# Patient Record
Sex: Female | Born: 1965 | Race: White | Smoking: Never smoker
Health system: Southern US, Community
[De-identification: ages and names within clinical notes are randomized; demographics above are authoritative.]

## PROBLEM LIST (undated history)

## (undated) DIAGNOSIS — K760 Fatty (change of) liver, not elsewhere classified: Secondary | ICD-10-CM

## (undated) DIAGNOSIS — G4730 Sleep apnea, unspecified: Secondary | ICD-10-CM

## (undated) DIAGNOSIS — Z87442 Personal history of urinary calculi: Secondary | ICD-10-CM

## (undated) DIAGNOSIS — Z9889 Other specified postprocedural states: Secondary | ICD-10-CM

## (undated) DIAGNOSIS — R51 Headache: Secondary | ICD-10-CM

## (undated) DIAGNOSIS — R112 Nausea with vomiting, unspecified: Secondary | ICD-10-CM

## (undated) DIAGNOSIS — F329 Major depressive disorder, single episode, unspecified: Secondary | ICD-10-CM

## (undated) DIAGNOSIS — M797 Fibromyalgia: Secondary | ICD-10-CM

## (undated) DIAGNOSIS — F32A Depression, unspecified: Secondary | ICD-10-CM

## (undated) DIAGNOSIS — N3010 Interstitial cystitis (chronic) without hematuria: Secondary | ICD-10-CM

## (undated) DIAGNOSIS — F419 Anxiety disorder, unspecified: Secondary | ICD-10-CM

## (undated) DIAGNOSIS — R519 Headache, unspecified: Secondary | ICD-10-CM

## (undated) HISTORY — PX: OTHER SURGICAL HISTORY: SHX169

## (undated) HISTORY — PX: FOOT SURGERY: SHX648

## (undated) HISTORY — PX: CYSTOSCOPY WITH HYDRODISTENSION AND BIOPSY: SHX5127

## (undated) HISTORY — PX: ABDOMINAL HYSTERECTOMY: SHX81

---

## 2014-03-11 ENCOUNTER — Other Ambulatory Visit: Payer: Self-pay | Admitting: Specialist

## 2014-03-11 DIAGNOSIS — M1610 Unilateral primary osteoarthritis, unspecified hip: Secondary | ICD-10-CM

## 2014-03-15 ENCOUNTER — Ambulatory Visit
Admission: RE | Admit: 2014-03-15 | Discharge: 2014-03-15 | Disposition: A | Discharge status: Home / Self Care | Payer: Medicaid Other | Source: Ambulatory Visit | Attending: Specialist | Admitting: Specialist

## 2014-03-15 DIAGNOSIS — M1610 Unilateral primary osteoarthritis, unspecified hip: Secondary | ICD-10-CM

## 2014-03-15 MED ORDER — IOHEXOL 180 MG/ML  SOLN
1.0000 mL | Freq: Once | INTRAMUSCULAR | Status: AC | PRN
  Administered 2014-03-15: 1 mL via INTRA_ARTICULAR

## 2014-03-15 MED ORDER — METHYLPREDNISOLONE ACETATE 40 MG/ML INJ SUSP (RADIOLOG
120.0000 mg | Freq: Once | INTRAMUSCULAR | Status: AC
  Administered 2014-03-15: 120 mg via INTRA_ARTICULAR

## 2014-03-15 NOTE — Discharge Instructions (Signed)

## 2014-03-15 NOTE — Progress Notes (Signed)
Cold pack to injection site.

## 2014-09-05 ENCOUNTER — Other Ambulatory Visit: Payer: Self-pay | Admitting: Specialist

## 2014-09-05 DIAGNOSIS — M545 Low back pain: Secondary | ICD-10-CM

## 2014-09-11 ENCOUNTER — Other Ambulatory Visit: Payer: Medicaid Other

## 2014-09-30 ENCOUNTER — Ambulatory Visit
Admission: RE | Admit: 2014-09-30 | Discharge: 2014-09-30 | Disposition: A | Discharge status: Home / Self Care | Payer: Medicaid Other | Source: Ambulatory Visit | Attending: Specialist | Admitting: Specialist

## 2014-09-30 DIAGNOSIS — M545 Low back pain: Secondary | ICD-10-CM

## 2016-03-26 ENCOUNTER — Telehealth (INDEPENDENT_AMBULATORY_CARE_PROVIDER_SITE_OTHER): Payer: Self-pay | Admitting: Specialist

## 2016-03-26 NOTE — Telephone Encounter (Signed)
Patient scheduled 05/06/16 at 3:00pm [phone]

## 2016-05-06 ENCOUNTER — Ambulatory Visit (INDEPENDENT_AMBULATORY_CARE_PROVIDER_SITE_OTHER): Payer: Self-pay | Admitting: Specialist

## 2016-05-27 ENCOUNTER — Ambulatory Visit (INDEPENDENT_AMBULATORY_CARE_PROVIDER_SITE_OTHER): Payer: Medicaid Other

## 2016-05-27 ENCOUNTER — Ambulatory Visit (INDEPENDENT_AMBULATORY_CARE_PROVIDER_SITE_OTHER): Payer: Medicaid Other | Admitting: Specialist

## 2016-05-27 ENCOUNTER — Encounter (INDEPENDENT_AMBULATORY_CARE_PROVIDER_SITE_OTHER): Payer: Self-pay | Admitting: Specialist

## 2016-05-27 VITALS — BP 121/85 | HR 78 | Ht 65.0 in | Wt 220.0 lb

## 2016-05-27 DIAGNOSIS — G8929 Other chronic pain: Secondary | ICD-10-CM | POA: Diagnosis not present

## 2016-05-27 DIAGNOSIS — M25512 Pain in left shoulder: Secondary | ICD-10-CM | POA: Diagnosis not present

## 2016-05-27 DIAGNOSIS — M533 Sacrococcygeal disorders, not elsewhere classified: Secondary | ICD-10-CM

## 2016-05-27 DIAGNOSIS — M545 Low back pain: Secondary | ICD-10-CM | POA: Diagnosis not present

## 2016-05-27 MED ORDER — METHYLPREDNISOLONE ACETATE 40 MG/ML IJ SUSP
40.0000 mg | INTRAMUSCULAR | Status: AC | PRN
  Administered 2016-05-27: 40 mg via INTRA_ARTICULAR

## 2016-05-27 MED ORDER — BUPIVACAINE HCL 0.25 % IJ SOLN
4.0000 mL | INTRAMUSCULAR | Status: AC | PRN
  Administered 2016-05-27: 4 mL via INTRA_ARTICULAR

## 2016-05-27 MED ORDER — LIDOCAINE HCL 1 % IJ SOLN
3.0000 mL | INTRAMUSCULAR | Status: AC | PRN
  Administered 2016-05-27: 3 mL

## 2016-05-27 NOTE — Progress Notes (Signed)
Office Visit Note   Patient: Katrina Clark           Date of Birth: Apr 21, 1965           MRN: [id number] Visit Date: 05/27/2016              Requested by: No referring provider defined for this encounter. PCP: Jerry Caras   Assessment & Plan: Visit Diagnoses:  1. Chronic left shoulder pain   2. Low back pain, unspecified back pain laterality, unspecified chronicity, with sciatica presence unspecified   3. Sacrococcygeal pain   4. Chronic SI joint pain     Plan: Degenerative patient some relief of her shoulder pain I offered injection. Patient sent left shoulder subacromial Marcaine/Depo-Medrol injection was done from a posterolateral approach. We will schedule patient for bilateral diagnostic/therapeutic SI joint injections with Dr. Alvester Morin. She's had this done back in February 2016 and thinks that she did pretty well with that. Last office visit with Dr. Otelia Sergeant a couple years ago he ordered a three-phase bone scan for sacrococcygeal pain but it was not done. reordered the study today. We'll see patient back in 3 weeks for recheck. If her shoulder is not better I  will also plan to schedule MRI to rule out rotator cuff tear. All questions answered.   Follow-Up Instructions: Return in about 3 weeks (around 06/17/2016).   Orders:  Orders Placed This Encounter  Procedures  . Large Joint Injection/Arthrocentesis  . XR Shoulder Left  . XR Lumbar Spine 2-3 Views  . NM Bone Scan 3 Phase  . Ambulatory referral to Physical Medicine Rehab   No orders of the defined types were placed in this encounter.     Procedures: Large Joint Inj Date/Time: 05/27/2016 3:23 PM Performed by: Naida Sleight Authorized by: Naida Sleight   Consent Given by:  Patient Indications:  Pain Location:  Shoulder Site:  L subacromial bursa Needle Size:  25 G Needle Length:  1.5 inches Approach:  Posterior Ultrasound Guidance: No   Fluoroscopic Guidance: No   Arthrogram: No   Medications:  3 mL  lidocaine 1 %; 4 mL bupivacaine 0.25 %; 40 mg methylPREDNISolone acetate 40 MG/ML Aspiration Attempted: No   Patient tolerance:  Patient tolerated the procedure well with no immediate complications     Clinical Data: No additional findings.   Subjective: Chief Complaint  Patient presents with  . Left Shoulder - Pain  . Lower Back - Pain    [name] is here for her left shoulder pain and low back pain.  She states that her low back is popping when she is bending over and hurts when she stands back up.  Also a couple of years ago she did something to her shoulder and now she has flared it back up again.    Patient has known history of bilateral SI joint pain and coccydynia. She's had injections in both areas in the past. No improvement with the coccyx injection.  Couple years ago Dr. Otelia Sergeant had ordered a bone scan but this was not done.  Left shoulder pain with overactivity and reach mid her back. Does bother her at night when she lies on her left side. No recent injury. She had left shoulder subacromial Marcaine/Depo-Medrol injection performed October 2016 with good relief. Recent left shoulder pain ongoing for a few weeks. No cervical spine or radicular component.  Review of Systems  Constitutional: Negative.   HENT: Negative.   Respiratory: Negative.   Cardiovascular: Negative.  Genitourinary: Negative.   Musculoskeletal: Positive for back pain.  Neurological: Negative.   Psychiatric/Behavioral: Negative.      Objective: Vital Signs: BP 121/85 (BP Location: Left Arm, Patient Position: Sitting)   Pulse 78   Ht 5\' 5"  (1.651 m)   Wt 220 lb (99.8 kg)   BMI 36.61 kg/m   Physical Exam  Constitutional: She is oriented to person, place, and time. No distress.  HENT:  Head: Normocephalic and atraumatic.  Eyes: EOM are normal. Pupils are equal, round, and reactive to light.  Neck: Normal range of motion.  Pulmonary/Chest: No respiratory distress.  Musculoskeletal:  Gait is  normal. Cervical spine good range of motion. Left shoulder she has good range of motion but with some discomfort. Negative drop arm test. Positive impingement test. Pain with supraspinatus resistance. Right shoulder unremarkable. She is moderately tender over the bilateral SI joints. Positive FABER test. Negative straight leg raise. Neurovascularly intact. No focal motor deficits.  Neurological: She is alert and oriented to person, place, and time.  Skin: Skin is warm and dry.  Psychiatric: She has a normal mood and affect.    Ortho Exam  Specialty Comments:  No specialty comments available.  Imaging: Xr Lumbar Spine 2-3 Views  Result Date: 05/27/2016 X-rays lumbar spine shows disc spaces to be well-maintained. No listhesis. No acute findings.  Xr Shoulder Left  Result Date: 05/27/2016 X-ray left shoulder shows good bony anatomy. No acute findings. Type I acromion.    PMFS History: There are no active problems to display for this patient.  No past medical history on file.  No family history on file.  No past surgical history on file. Social History   Occupational History  . Not on file.   Social History Main Topics  . Smoking status: Never Smoker  . Smokeless tobacco: Never Used  . Alcohol use Not on file  . Drug use: Unknown  . Sexual activity: Not on file

## 2016-06-10 ENCOUNTER — Encounter (INDEPENDENT_AMBULATORY_CARE_PROVIDER_SITE_OTHER): Payer: Self-pay | Admitting: Physical Medicine and Rehabilitation

## 2016-06-10 ENCOUNTER — Ambulatory Visit (INDEPENDENT_AMBULATORY_CARE_PROVIDER_SITE_OTHER): Payer: Medicaid Other | Admitting: Physical Medicine and Rehabilitation

## 2016-06-10 ENCOUNTER — Ambulatory Visit (INDEPENDENT_AMBULATORY_CARE_PROVIDER_SITE_OTHER): Payer: Medicaid Other

## 2016-06-10 VITALS — BP 134/84 | HR 86

## 2016-06-10 DIAGNOSIS — M461 Sacroiliitis, not elsewhere classified: Secondary | ICD-10-CM

## 2016-06-10 NOTE — Progress Notes (Signed)
Katrina Clark - 51 y.o. female MRN [id number]  Date of birth: [birth year]  Office Visit Note: Visit Date: 06/10/2016 PCP: Jerry Caras, MD Referred by: Jerry Caras, MD  Subjective: Chief Complaint  Patient presents with  . Lower Back - Pain   HPI: Katrina Clark is a 51 year old female with chronic worsening midline sacral and pelvic pain. Pain in the center of low back and then radiates down tailbone. Constant pain. Worse with standing. Denies pain down leg. Dr. Otelia Sergeant requests bilateral sacroiliac joint injection.    ROS Otherwise per HPI.  Assessment & Plan: Visit Diagnoses:  1. Sacroiliitis (HCC)     Plan: Findings:  Bilateral sacroiliac joint injections with fluoroscopic guidance. The arthrogram showed partial flow into the lower third of both joints. The feel of the needle in both joints was intra-articular.    Meds & Orders: No orders of the defined types were placed in this encounter.   Orders Placed This Encounter  Procedures  . Large Joint Injection/Arthrocentesis  . Large Joint Injection/Arthrocentesis  . XR C-ARM NO REPORT    Follow-up: No Follow-up on file.   Procedures: Large Joint Inj Date/Time: 06/10/2016 3:37 PM Performed by: Tyrell Antonio Authorized by: Tyrell Antonio   Consent Given by:  Patient Site marked: the procedure site was marked   Timeout: prior to procedure the correct patient, procedure, and site was verified   Indications:  Pain and diagnostic evaluation Location:  Sacroiliac Site:  L sacroiliac joint Prep: patient was prepped and draped in usual sterile fashion   Needle Size:  22 G Needle Length:  3.5 inches Approach:  Posterior Ultrasound Guidance: No   Fluoroscopic Guidance: Yes   Arthrogram: No   Medications:  2 mL bupivacaine 0.5 %; 40 mg triamcinolone acetonide 40 MG/ML Aspiration Attempted: No   Patient tolerance:  Patient tolerated the procedure well with no immediate complications  There was excellent  flow of contrast producing a partial arthrogram of the sacroiliac joint.  Large Joint Inj Date/Time: 06/10/2016 3:38 PM Performed by: Tyrell Antonio Authorized by: Tyrell Antonio   Consent Given by:  Patient Site marked: the procedure site was marked   Timeout: prior to procedure the correct patient, procedure, and site was verified   Indications:  Pain and diagnostic evaluation Location:  Sacroiliac Site:  R sacroiliac joint Prep: patient was prepped and draped in usual sterile fashion   Needle Size:  22 G Needle Length:  3.5 inches Approach:  Posterior Ultrasound Guidance: No   Fluoroscopic Guidance: Yes   Arthrogram: No   Medications:  2 mL bupivacaine 0.5 %; 40 mg triamcinolone acetonide 40 MG/ML Aspiration Attempted: No   Patient tolerance:  Patient tolerated the procedure well with no immediate complications  There was excellent flow of contrast producing a partial arthrogram of the sacroiliac joint.     No notes on file   Clinical History: No specialty comments available.  She reports that she has never smoked. She has never used smokeless tobacco. No results for input(s): HGBA1C, LABURIC in the last 8760 hours.  Objective:  VS:  HT:    WT:   BMI:     BP:134/84  HR:86bpm  TEMP: ( )  RESP:  Physical Exam  Musculoskeletal:  Pain over the bilateral PSIS with pain to palpation in the midline sacral area. Good strength    Ortho Exam Imaging: No results found.  Past Medical/Family/Surgical/Social History: Medications & Allergies reviewed per EMR There are no active problems to display for this patient.  No past medical history on file. No family history on file. No past surgical history on file. Social History   Occupational History  . Not on file.   Social History Main Topics  . Smoking status: Never Smoker  . Smokeless tobacco: Never Used  . Alcohol use Not on file  . Drug use: Unknown  . Sexual activity: Not on file

## 2016-06-10 NOTE — Patient Instructions (Signed)

## 2016-06-14 ENCOUNTER — Encounter (HOSPITAL_COMMUNITY)
Admission: RE | Admit: 2016-06-14 | Discharge: 2016-06-14 | Disposition: A | Discharge status: Home / Self Care | Payer: Medicaid Other | Source: Ambulatory Visit | Attending: Surgery | Admitting: Surgery

## 2016-06-14 DIAGNOSIS — M533 Sacrococcygeal disorders, not elsewhere classified: Secondary | ICD-10-CM | POA: Insufficient documentation

## 2016-06-14 MED ORDER — BUPIVACAINE HCL 0.5 % IJ SOLN
2.0000 mL | INTRAMUSCULAR | Status: AC | PRN
  Administered 2016-06-10: 2 mL via INTRA_ARTICULAR

## 2016-06-14 MED ORDER — TECHNETIUM TC 99M MEDRONATE IV KIT
25.0000 | PACK | Freq: Once | INTRAVENOUS | Status: AC | PRN
  Administered 2016-06-14: 19.2 via INTRAVENOUS

## 2016-06-14 MED ORDER — TRIAMCINOLONE ACETONIDE 40 MG/ML IJ SUSP
40.0000 mg | INTRAMUSCULAR | Status: AC | PRN
  Administered 2016-06-10: 40 mg via INTRA_ARTICULAR

## 2016-07-08 ENCOUNTER — Encounter (INDEPENDENT_AMBULATORY_CARE_PROVIDER_SITE_OTHER): Payer: Self-pay | Admitting: Specialist

## 2016-07-08 ENCOUNTER — Ambulatory Visit (INDEPENDENT_AMBULATORY_CARE_PROVIDER_SITE_OTHER): Payer: Medicaid Other | Admitting: Specialist

## 2016-07-08 VITALS — BP 121/81 | HR 70 | Ht 65.0 in | Wt 220.0 lb

## 2016-07-08 DIAGNOSIS — M5136 Other intervertebral disc degeneration, lumbar region: Secondary | ICD-10-CM | POA: Diagnosis not present

## 2016-07-08 DIAGNOSIS — M533 Sacrococcygeal disorders, not elsewhere classified: Secondary | ICD-10-CM | POA: Diagnosis not present

## 2016-07-08 MED ORDER — TETRACYCLINE HCL 500 MG PO CAPS: 500.0000 mg | ORAL_CAPSULE | Freq: Four times a day (QID) | ORAL | 0 refills | Status: DC

## 2016-07-08 NOTE — Progress Notes (Signed)
   Office Visit Note   Patient: Katrina MurrainSandra Clark           Date of Birth: [birth year]           MRN: [id number] Visit Date: 07/08/2016              Requested by: Jerry CarasPatterson, Jonathan, MD [address] [place], [place] [id number] PCP: Jerry CarasPatterson, Jonathan, MD   Assessment & Plan: Visit Diagnoses:  1. Sacroiliac joint dysfunction of left side   2. Degenerative disc disease, lumbar     Plan:Avoid frequent bending and stooping  No lifting greater than 10 lbs. May use ice or moist heat for pain. Weight loss is of benefit. 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     Follow-Up Instructions: No Follow-up on file.   Orders:  No orders of the defined types were placed in this encounter.  Meds ordered this encounter  Medications  . tetracycline (ACHROMYCIN,SUMYCIN) 500 MG capsule    Sig: Take 1 capsule (500 mg total) by mouth 4 (four) times daily.    Dispense:  40 capsule    Refill:  0      Procedures: No procedures performed   Clinical Data: No additional findings.   Subjective: Chief Complaint  Patient presents with  . Lower Back - Follow-up    HPI  Review of Systems   Objective: Vital Signs: BP 121/81 (BP Location: Right Arm, Patient Position: Sitting)   Pulse 70   Ht 5\' 5"  (1.651 m)   Wt 220 lb (99.8 kg)   BMI 36.61 kg/m   Physical Exam  Back Exam   Tenderness  The patient is experiencing tenderness in the sacroiliac and lumbar.  Range of Motion  Extension: normal  Flexion: normal  Lateral Bend Right: normal  Lateral Bend Left: normal  Rotation Right: normal  Rotation Left: normal   Muscle Strength  Right Quadriceps:  5/5  Left Quadriceps:  5/5  Right Hamstrings:  5/5  Left Hamstrings:  5/5   Tests  Straight leg raise right: negative Straight leg raise left: negative  Reflexes  Babinski's sign: normal   Other  Toe Walk: normal Heel Walk: normal Sensation: normal Gait: normal  Erythema: no back  redness Scars: absent  Comments:  Figure of four is painful left greater than right.      Specialty Comments:  No specialty comments available.  Imaging: No results found.   PMFS History: There are no active problems to display for this patient.  No past medical history on file.  No family history on file.  No past surgical history on file. Social History   Occupational History  . Not on file.   Social History Main Topics  . Smoking status: Never Smoker  . Smokeless tobacco: Never Used  . Alcohol use Not on file  . Drug use: Unknown  . Sexual activity: Not on file

## 2016-07-08 NOTE — Patient Instructions (Addendum)
Avoid frequent bending and stooping  No lifting greater than 10 lbs. May use ice or moist heat for pain. Weight loss is of benefit. 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

## 2016-07-19 ENCOUNTER — Other Ambulatory Visit (INDEPENDENT_AMBULATORY_CARE_PROVIDER_SITE_OTHER): Payer: Self-pay | Admitting: Specialist

## 2016-07-19 ENCOUNTER — Telehealth (INDEPENDENT_AMBULATORY_CARE_PROVIDER_SITE_OTHER): Payer: Self-pay | Admitting: Radiology

## 2016-07-19 MED ORDER — AZITHROMYCIN 500 MG PO TABS: 1000.0000 mg | ORAL_TABLET | Freq: Every day | ORAL | 0 refills | Status: DC

## 2016-07-19 NOTE — Telephone Encounter (Signed)
Rx for 2 500 mg tablets of Azithromax sent to her pharmacy, she should take both once. jen

## 2016-07-19 NOTE — Telephone Encounter (Signed)
I called lmom for patient to advised her the medication was denied and that we had sent in something else and that she should take both tablets at one time. If she has questions she can call us back.

## 2016-07-19 NOTE — Telephone Encounter (Signed)
Insurance has denied Tetracycline due to not trying and failing 2 preferred drugs first.  I am not sure if there is something else you want to try for her?

## 2016-08-27 ENCOUNTER — Ambulatory Visit (INDEPENDENT_AMBULATORY_CARE_PROVIDER_SITE_OTHER): Payer: Medicaid Other | Admitting: Specialist

## 2016-12-02 ENCOUNTER — Ambulatory Visit (INDEPENDENT_AMBULATORY_CARE_PROVIDER_SITE_OTHER): Payer: Medicaid Other

## 2016-12-02 ENCOUNTER — Ambulatory Visit (INDEPENDENT_AMBULATORY_CARE_PROVIDER_SITE_OTHER): Payer: Medicaid Other | Admitting: Surgery

## 2016-12-02 ENCOUNTER — Encounter (INDEPENDENT_AMBULATORY_CARE_PROVIDER_SITE_OTHER): Payer: Self-pay | Admitting: Surgery

## 2016-12-02 VITALS — BP 113/79 | HR 71 | Ht 65.0 in | Wt 220.0 lb

## 2016-12-02 DIAGNOSIS — M542 Cervicalgia: Secondary | ICD-10-CM | POA: Diagnosis not present

## 2016-12-02 DIAGNOSIS — L03011 Cellulitis of right finger: Secondary | ICD-10-CM | POA: Diagnosis not present

## 2016-12-02 DIAGNOSIS — G8929 Other chronic pain: Secondary | ICD-10-CM | POA: Diagnosis not present

## 2016-12-02 DIAGNOSIS — W540XXA Bitten by dog, initial encounter: Secondary | ICD-10-CM | POA: Diagnosis not present

## 2016-12-02 DIAGNOSIS — M79644 Pain in right finger(s): Secondary | ICD-10-CM

## 2016-12-02 DIAGNOSIS — M25512 Pain in left shoulder: Secondary | ICD-10-CM | POA: Diagnosis not present

## 2016-12-02 MED ORDER — KETOROLAC TROMETHAMINE 30 MG/ML IJ SOLN: 30.0000 mg | Freq: Once | INTRAMUSCULAR | Status: AC

## 2016-12-02 MED ORDER — DOXYCYCLINE HYCLATE 100 MG PO CAPS: 100.0000 mg | ORAL_CAPSULE | Freq: Two times a day (BID) | ORAL | 0 refills | Status: DC

## 2016-12-02 MED ORDER — METRONIDAZOLE 500 MG PO TABS: 500.0000 mg | ORAL_TABLET | Freq: Three times a day (TID) | ORAL | 0 refills | Status: DC

## 2016-12-02 MED ORDER — METHOCARBAMOL 500 MG PO TABS: 500.0000 mg | ORAL_TABLET | Freq: Three times a day (TID) | ORAL | 0 refills | Status: DC | PRN

## 2016-12-02 NOTE — Progress Notes (Signed)
Office Visit Note   Patient: Katrina Clark           Date of Birth: Oct 30, 1965           MRN: [id number] Visit Date: 12/02/2016              Requested by: Jerry Caras, MD [address] [place], [place] [address] PCP: Jerry Caras, MD   Assessment & Plan: Visit Diagnoses:  1. Neck pain   2. Chronic left shoulder pain   3. Pain of right thumb   4. Cellulitis of right thumb   5. Dog bite, initial encounter     Plan: Regards to patient's shoulder pain offered conservative treatment with portal 30 mg IM injection. Patient also given prescription for Robaxin 500 mg 1 tab by mouth every 8 hours when necessary spasms. She can continue over-the-counter ibuprofen when necessary with food. Advised patient that I am somewhat concerned regarding the dog bite to her right thumb. I did ask Dr. August Saucer to see patient with me as well. He recommended drilling 3 small holes into the nailbed to help decompress this area some. After patient consent distal thumb was prepped with Betadine and I used 3 separate 18-gauge needles to drill a small hole into the nailbed. I was able to get out a small amount of bloody serous colored fluid. Fluid was sent for culture. The plan was to put patient on Augmentin but she does have a true penicillin allergy. Spoke with the [facility] inpatient pharmacist and he recommended doxycycline 100 mg by mouth twice a day and Flagyl 500 mg by mouth 3 times a day. Patient will follow up in the office Monday morning with Dr. August Saucer for recheck. Advised patient that she can change her dressing daily and if she has any increased pain, redness, swelling go immediately to the ER. She'll also watch out for pain in the joint.   F some serumollow-Up Instructions: Return in about 4 days (around 12/06/2016) for Dr August Saucer.   Orders:  Orders Placed This Encounter  Procedures  . Wound culture  . XR Cervical Spine 2 or 3 views  . XR Shoulder Left  . XR Finger Thumb Right   Meds  ordered this encounter  Medications  . methocarbamol (ROBAXIN) 500 MG tablet    Sig: Take 1 tablet (500 mg total) by mouth every 8 (eight) hours as needed for muscle spasms.    Dispense:  40 tablet    Refill:  0  . metroNIDAZOLE (FLAGYL) 500 MG tablet    Sig: Take 1 tablet (500 mg total) by mouth 3 (three) times daily.    Dispense:  21 tablet    Refill:  0  . DISCONTD: doxycycline (VIBRAMYCIN) 100 MG capsule    Sig: Take 1 capsule (100 mg total) by mouth 2 (two) times daily.    Dispense:  20 capsule    Refill:  0  . doxycycline (VIBRAMYCIN) 100 MG capsule    Sig: Take 1 capsule (100 mg total) by mouth 2 (two) times daily.    Dispense:  20 capsule    Refill:  0  . ketorolac (TORADOL) 30 MG/ML injection 30 mg      Procedures: No procedures performed   Clinical Data: No additional findings.   Subjective: Chief Complaint  Patient presents with  . Left Shoulder - Pain, Injury    HPI Patient comes in today with complaints of left shoulder pain and left-sided neck pain. Patient states that 1-2  weeks ago she slipped and fell landing on her left side with an outstretched hand. Immediately began having pain in the left side of her neck, left collar bone in left anterior shoulder. Currently areas of pain in her aggravated with overhead reaching and going behind her back. No complaints of pain radiating down the arm. No numbness and tingling. The shoulder was doing reasonably well after subacromial Marcaine/Depo-Medrol injection that I performed March 2018. After addressing patient's main complaint she brought up an issue regarding her right thumb. States that about 2 weeks ago she was playing with a a friend's year-old pitbull puppy and the dog bit her right thumb and punctured her nail.  Immediate bleeding after. She has been having pain and some swelling and redness in the distal thumb not involving the IP joint. No complaints of fever chills. Patient states that she has not been seen  for this by a physician. Patient also states that the dog is up on all of its vaccinations. Review of Systems No current cardiac pulmonary GI GU issues.  Objective: Vital Signs: BP 113/79 (BP Location: Left Arm, Patient Position: Sitting)   Pulse 71   Ht  (1.651 m)   Wt 220 lb (99.8 kg)   BMI 36.61 kg/m   Physical Exam  Constitutional: She is oriented to person, place, and time. She appears well-developed. No distress.  HENT:  Head: Normocephalic and atraumatic.  Eyes: Pupils are equal, round, and reactive to light. EOM are normal.  Neck:  Good cervical spine range of motion. Mild to moderate left brachial plexus and trapezius tenderness.   Pulmonary/Chest: No respiratory distress.  Musculoskeletal:  Left shoulder she has good range of motion but with discomfort. Positive impingement test. She is moderate to markedly tender over the mid to distal clavicle. No obvious deformity. Anterior shoulder tender to palpation. Negative drop arm test. Right thumb she does have some swelling distally around the nailbed. There is a small puncture wound in the nail from the dog bite. IP joint has good range motion. IP joint is nontender. No fluctuance. There is some redness around the nail bed.  Neurological: She is alert and oriented to person, place, and time.  Skin: Skin is warm and dry.  Psychiatric: She has a normal mood and affect.    Ortho Exam  Specialty Comments:  No specialty comments available.  Imaging: Xr Finger Thumb Right  Result Date: 12/02/2016 X-ray right thumb shows good bony anatomy. No acute finding.  Xr Cervical Spine 2 Or 3 Views  Result Date: 12/02/2016 Sore spine x-ray shows disc spaces to be well-maintained. Straightening of the cervical lordosis. No acute finding.  Xr Shoulder Left  Result Date: 12/02/2016 X-ray left shoulder shows good bony anatomy. No acute finding. No obvious signs of fracture. Reviewed with Dr. August Saucer.    PMFS History: There are no  active problems to display for this patient.  No past medical history on file.  No family history on file.  No past surgical history on file. Social History   Occupational History  . Not on file.   Social History Main Topics  . Smoking status: Never Smoker  . Smokeless tobacco: Never Used  . Alcohol use Not on file  . Drug use: Unknown  . Sexual activity: Not on file

## 2016-12-05 LAB — WOUND CULTURE
MICRO NUMBER:: 81104109
SPECIMEN QUALITY: ADEQUATE

## 2016-12-06 ENCOUNTER — Ambulatory Visit (INDEPENDENT_AMBULATORY_CARE_PROVIDER_SITE_OTHER): Payer: Medicaid Other

## 2016-12-06 ENCOUNTER — Encounter (INDEPENDENT_AMBULATORY_CARE_PROVIDER_SITE_OTHER): Payer: Self-pay | Admitting: Orthopedic Surgery

## 2016-12-06 ENCOUNTER — Ambulatory Visit (INDEPENDENT_AMBULATORY_CARE_PROVIDER_SITE_OTHER): Payer: Medicaid Other | Admitting: Orthopedic Surgery

## 2016-12-06 DIAGNOSIS — M79672 Pain in left foot: Secondary | ICD-10-CM

## 2016-12-07 NOTE — Progress Notes (Signed)
Office Visit Note   Patient: Katrina Clark           Date of Birth: [birth year]           MRN: [id number] Visit Date: 12/06/2016 Requested by: Jerry Caras, MD [address] [place], [place] [address] PCP: Jerry Caras, MD  Subjective: Chief Complaint  Patient presents with  . Right Hand - Pain  . Left Foot - Injury    HPI: Katrina Clark is a patient with right thumb injury.  She was bitten by a dog.  Here for recheck on cellulitis.  Patient states she is doing some better.  She's been taking Flagyl and doxycycline.  She also states that she stepped off a moving golf cart on Saturday and injured her left foot.  Describes pain and swelling in that area.  Has had previous Lisfranc fracture fixation surgery on that left foot.  Is not having any fevers or chills.              ROS: All systems reviewed are negative as they relate to the chief complaint within the history of present illness.  Patient denies  fevers or chills.   Assessment & Plan: Visit Diagnoses:  1. Pain in left foot     Plan: Impression is gradual improvement in left thumb.  Plan is to recheck this on Friday.  Like for her to have more complete resolution before we let her go.  I think that they're still possibility that surgical intervention will be required on the thumb but no evidence of fluctuance or pus draining today.  Left foot radiographs are normal.  He can treat that conservatively.  Follow-Up Instructions: No Follow-up on file.   Orders:  Orders Placed This Encounter  Procedures  . XR Foot Complete Left   No orders of the defined types were placed in this encounter.     Procedures: No procedures performed   Clinical Data: No additional findings.  Objective: Vital Signs: There were no vitals taken for this visit.  Physical Exam:   Constitutional: Patient appears well-developed HEENT:  Head: Normocephalic Eyes:EOM are normal Neck: Normal range of motion Cardiovascular: Normal  rate Pulmonary/chest: Effort normal Neurologic: Patient is alert Skin: Skin is warm Psychiatric: Patient has normal mood and affect    Ortho Exam: Orthopedic exam demonstrates intact EPL FPL function to the right thumb.  Still a little tender at the hyponychium around the germinal matrix.  No other masses lymph adenopathy or skin changes noted in the left arm region or right arm and thumb region.  Slight erythema is noted but there is no fluctuance present.  Left foot examination demonstrates mild swelling but no real pain with pronation supination of the foot.  This range of motion is predictably diminished.  He.  Patient has palpable and her to posterior tib peroneal and Achilles function.  No crepitus is noted with palpation of the ankle joint.  Tibiotalar subtalar range of motion normal and full  Specialty Comments:  No specialty comments available.  Imaging: No results found.   PMFS History: There are no active problems to display for this patient.  No past medical history on file.  No family history on file.  No past surgical history on file. Social History   Occupational History  . Not on file.   Social History Main Topics  . Smoking status: Never Smoker  . Smokeless tobacco: Never Used  . Alcohol use Not on file  . Drug use: Unknown  .  Sexual activity: Not on file

## 2016-12-08 ENCOUNTER — Encounter (INDEPENDENT_AMBULATORY_CARE_PROVIDER_SITE_OTHER): Payer: Self-pay | Admitting: Orthopedic Surgery

## 2016-12-08 NOTE — Telephone Encounter (Signed)
See my chart message patient sent today   Dr. August Saucer,   My thumb is not any better and I will have been on antibiotics for a week. Can you just schedule to have my nail removed for Monday? Thank you. Katrina Clark

## 2016-12-08 NOTE — Telephone Encounter (Signed)
Y blue sheet done pls clal [id number]

## 2016-12-10 ENCOUNTER — Ambulatory Visit (INDEPENDENT_AMBULATORY_CARE_PROVIDER_SITE_OTHER): Payer: Medicaid Other

## 2016-12-13 ENCOUNTER — Encounter: Payer: Self-pay | Admitting: Orthopedic Surgery

## 2016-12-13 DIAGNOSIS — L03011 Cellulitis of right finger: Secondary | ICD-10-CM | POA: Diagnosis not present

## 2016-12-22 ENCOUNTER — Ambulatory Visit (INDEPENDENT_AMBULATORY_CARE_PROVIDER_SITE_OTHER): Payer: Medicaid Other | Admitting: Orthopedic Surgery

## 2016-12-22 ENCOUNTER — Encounter (INDEPENDENT_AMBULATORY_CARE_PROVIDER_SITE_OTHER): Payer: Self-pay | Admitting: Orthopedic Surgery

## 2016-12-22 DIAGNOSIS — L03011 Cellulitis of right finger: Secondary | ICD-10-CM

## 2016-12-22 MED ORDER — HYDROCODONE-ACETAMINOPHEN 5-325 MG PO TABS: 1.0000 | ORAL_TABLET | Freq: Four times a day (QID) | ORAL | 0 refills | Status: DC | PRN

## 2016-12-22 NOTE — Progress Notes (Signed)
   Post-Op Visit Note   Patient: Katrina MurrainSandra Clark           Date of Birth: 07/16/1965           MRN: [id number] Visit Date: 12/22/2016 PCP: Jerry CarasPatterson, Jonathan, MD   Assessment & Plan:  Chief Complaint:  Chief Complaint  Patient presents with  . Post-op Follow-up    right thumb   Visit Diagnoses:  1. Cellulitis of right thumb     Plan: Dois Katrina Clark is a 51 year old patient with right thumb pain.  Reports throbbing pain now 1 week out from nail removal and debridement.  On examination no evidence of infection.  Band-Aid placed.  Norco refill.  3 week return.  Do not want to get that nail bed wet at this time.  Follow-Up Instructions: Return in about 3 weeks (around 01/12/2017).   Orders:  No orders of the defined types were placed in this encounter.  Meds ordered this encounter  Medications  . HYDROcodone-acetaminophen (NORCO/VICODIN) 5-325 MG tablet    Sig: Take 1 tablet by mouth every 6 (six) hours as needed for moderate pain.    Dispense:  40 tablet    Refill:  0    Imaging: No results found.  PMFS History: There are no active problems to display for this patient.  No past medical history on file.  No family history on file.  No past surgical history on file. Social History   Occupational History  . Not on file.   Social History Main Topics  . Smoking status: Never Smoker  . Smokeless tobacco: Never Used  . Alcohol use Not on file  . Drug use: Unknown  . Sexual activity: Not on file

## 2017-01-12 ENCOUNTER — Encounter (INDEPENDENT_AMBULATORY_CARE_PROVIDER_SITE_OTHER): Payer: Self-pay | Admitting: Orthopedic Surgery

## 2017-01-12 ENCOUNTER — Ambulatory Visit (INDEPENDENT_AMBULATORY_CARE_PROVIDER_SITE_OTHER): Payer: Medicaid Other | Admitting: Orthopedic Surgery

## 2017-01-12 DIAGNOSIS — M7542 Impingement syndrome of left shoulder: Secondary | ICD-10-CM

## 2017-01-12 MED ORDER — BUPIVACAINE HCL 0.5 % IJ SOLN
9.0000 mL | INTRAMUSCULAR | Status: AC | PRN
  Administered 2017-01-12: 9 mL via INTRA_ARTICULAR

## 2017-01-12 MED ORDER — METHYLPREDNISOLONE ACETATE 40 MG/ML IJ SUSP
40.0000 mg | INTRAMUSCULAR | Status: AC | PRN
  Administered 2017-01-12: 40 mg via INTRA_ARTICULAR

## 2017-01-12 MED ORDER — LIDOCAINE HCL 1 % IJ SOLN
5.0000 mL | INTRAMUSCULAR | Status: AC | PRN
  Administered 2017-01-12: 5 mL

## 2017-01-12 NOTE — Progress Notes (Signed)
Office Visit Note   Patient: Katrina MurrainSandra Clark           Date of Birth: 03-08-1965           MRN: [id number] Visit Date: 01/12/2017 Requested by: Jerry CarasPatterson, Jonathan, MD [address] [place] [address] [place], [place] [address] PCP: Jerry CarasPatterson, Jonathan, MD  Subjective: Chief Complaint  Patient presents with  . Right Thumb - Follow-up    HPI: Katrina Clark is here for 3 week follow-up on her right thumb which had a dog bite injury.  She is also reporting left shoulder pain.  She states her right thumb is doing better overall.  She is wearing a Band-Aid.  She's had many month history of left shoulder pain H medical onset.  Localizes the pain to the mid clavicle region as well as the shoulder blade.  She reports some neck pain but no radiation down to the elbow.  Takes ibuprofen area and she describes not much in the way of weakness.  Previous radiographs of the left shoulder were normal.              ROS: All systems reviewed are negative as they relate to the chief complaint within the history of present illness.  Patient denies  fevers or chills.   Assessment & Plan: Visit Diagnoses:  1. Impingement syndrome of left shoulder     Plan: Impression is improvement in right thumb with good range of motion and no evidence of recurrent infection.  Left shoulder looks like may be bursitis.  Radiographs unremarkable.  I would favor a subacromial injection for impingement.  Come back in a month that she's no better we can do further imaging at that time area and that would be an MRI arthrogram  Follow-Up Instructions: Return if symptoms worsen or fail to improve.   Orders:  No orders of the defined types were placed in this encounter.  No orders of the defined types were placed in this encounter.     Procedures: Large Joint Inj: L subacromial bursa on 01/12/2017 1:42 PM Indications: diagnostic evaluation and pain Details: 18 G 1.5 in needle, posterior approach  Arthrogram: No  Medications: 9 mL bupivacaine  0.5 %; 40 mg methylPREDNISolone acetate 40 MG/ML; 5 mL lidocaine 1 % Outcome: tolerated well, no immediate complications Procedure, treatment alternatives, risks and benefits explained, specific risks discussed. Consent was given by the patient. Immediately prior to procedure a time out was called to verify the correct patient, procedure, equipment, support staff and site/side marked as required. Patient was prepped and draped in the usual sterile fashion.       Clinical Data: No additional findings.  Objective: Vital Signs: There were no vitals taken for this visit.  Physical Exam:   Constitutional: Patient appears well-developed HEENT:  Head: Normocephalic Eyes:EOM are normal Neck: Normal range of motion Cardiovascular: Normal rate Pulmonary/chest: Effort normal Neurologic: Patient is alert Skin: Skin is warm Psychiatric: Patient has normal mood and affect    Ortho Exam: Examination of the right thumb demonstrates intact EPL FPL function no real drainage or erythema around the hyponychium or eponychia.  The nails x-ray coming back nicely without a ridge.  Left shoulder is examined.  She has full active and passive range of motion of the left shoulder with some shoulder blade pain to palpation around the rhomboid attachment along with mid clavicular pain without crepitus or swelling.  Cuff strength is intact.  O'Brien's testing negative but impingement signs positive on the left.  No other  masses lymph adenopathy or skin changes noted in the left shoulder girdle region.  Specialty Comments:  No specialty comments available.  Imaging: No results found.   PMFS History: There are no active problems to display for this patient.  History reviewed. No pertinent past medical history.  History reviewed. No pertinent family history.  History reviewed. No pertinent surgical history. Social History   Occupational History  . Not on file  Tobacco Use  . Smoking status: Never  Smoker  . Smokeless tobacco: Never Used  Substance and Sexual Activity  . Alcohol use: Not on file  . Drug use: Not on file  . Sexual activity: Not on file

## 2017-01-31 ENCOUNTER — Encounter (INDEPENDENT_AMBULATORY_CARE_PROVIDER_SITE_OTHER): Payer: Self-pay | Admitting: Orthopedic Surgery

## 2017-02-01 NOTE — Telephone Encounter (Signed)
Need mri arthrogram pls calal htx

## 2017-02-03 ENCOUNTER — Other Ambulatory Visit (INDEPENDENT_AMBULATORY_CARE_PROVIDER_SITE_OTHER): Payer: Self-pay

## 2017-02-03 DIAGNOSIS — G8929 Other chronic pain: Secondary | ICD-10-CM

## 2017-02-03 DIAGNOSIS — M25512 Pain in left shoulder: Principal | ICD-10-CM

## 2017-02-11 ENCOUNTER — Ambulatory Visit (INDEPENDENT_AMBULATORY_CARE_PROVIDER_SITE_OTHER): Payer: Medicaid Other | Admitting: Orthopedic Surgery

## 2017-03-02 ENCOUNTER — Other Ambulatory Visit: Payer: Self-pay

## 2017-03-08 ENCOUNTER — Ambulatory Visit
Admission: RE | Admit: 2017-03-08 | Discharge: 2017-03-08 | Disposition: A | Discharge status: Home / Self Care | Payer: Medicaid Other | Source: Ambulatory Visit | Attending: Orthopedic Surgery | Admitting: Orthopedic Surgery

## 2017-03-08 DIAGNOSIS — M25512 Pain in left shoulder: Principal | ICD-10-CM

## 2017-03-08 DIAGNOSIS — G8929 Other chronic pain: Secondary | ICD-10-CM

## 2017-03-08 MED ORDER — IOPAMIDOL (ISOVUE-M 200) INJECTION 41%
15.0000 mL | Freq: Once | INTRAMUSCULAR | Status: AC
  Administered 2017-03-08: 15 mL via INTRA_ARTICULAR

## 2017-03-09 ENCOUNTER — Telehealth (INDEPENDENT_AMBULATORY_CARE_PROVIDER_SITE_OTHER): Payer: Self-pay

## 2017-03-09 ENCOUNTER — Encounter (INDEPENDENT_AMBULATORY_CARE_PROVIDER_SITE_OTHER): Payer: Self-pay | Admitting: Orthopedic Surgery

## 2017-03-09 NOTE — Telephone Encounter (Signed)
Patient has a superior labral tear.  In her age group the surgical treatment for that would be biceps tenodesis.  I would not recommend that unless she is having significant symptoms.  The rotator cuff is intact.  There is a surgical treatment for the problem she has and that can be done when her symptoms warrant that intervention

## 2017-03-09 NOTE — Telephone Encounter (Signed)
Patient went for MRI yesterday but sent my chart message stating she woke up with fever and sore throat and is requesting results to be sent to her via my chart. If you can advise, I will message her.

## 2017-03-09 NOTE — Telephone Encounter (Signed)
Relayed information to patient in mychart message per her request.

## 2017-03-10 ENCOUNTER — Ambulatory Visit (INDEPENDENT_AMBULATORY_CARE_PROVIDER_SITE_OTHER): Payer: Medicaid Other | Admitting: Orthopedic Surgery

## 2017-03-21 ENCOUNTER — Encounter (INDEPENDENT_AMBULATORY_CARE_PROVIDER_SITE_OTHER): Payer: Self-pay | Admitting: Orthopedic Surgery

## 2017-04-18 ENCOUNTER — Encounter (INDEPENDENT_AMBULATORY_CARE_PROVIDER_SITE_OTHER): Payer: Self-pay | Admitting: Orthopedic Surgery

## 2017-04-22 ENCOUNTER — Other Ambulatory Visit (INDEPENDENT_AMBULATORY_CARE_PROVIDER_SITE_OTHER): Payer: Self-pay | Admitting: Orthopedic Surgery

## 2017-04-22 DIAGNOSIS — S43432A Superior glenoid labrum lesion of left shoulder, initial encounter: Secondary | ICD-10-CM

## 2017-04-25 ENCOUNTER — Other Ambulatory Visit: Payer: Self-pay

## 2017-04-25 ENCOUNTER — Encounter (HOSPITAL_COMMUNITY): Payer: Self-pay | Admitting: Urology

## 2017-04-25 NOTE — Anesthesia Preprocedure Evaluation (Addendum)
Anesthesia Evaluation  Patient identified by MRN, date of birth, ID band Patient awake    Reviewed: Allergy & Precautions, NPO status , Patient's Chart, lab work & pertinent test results  History of Anesthesia Complications (+) PONV and history of anesthetic complications  Airway Mallampati: I  TM Distance: >3 FB Neck ROM: Full    Dental  (+) Teeth Intact, Dental Advisory Given   Pulmonary sleep apnea ,    breath sounds clear to auscultation       Cardiovascular negative cardio ROS   Rhythm:Regular Rate:Normal     Neuro/Psych  Headaches, PSYCHIATRIC DISORDERS Anxiety Depression  Neuromuscular disease    GI/Hepatic negative GI ROS, Neg liver ROS,   Endo/Other  negative endocrine ROS  Renal/GU negative Renal ROS     Musculoskeletal  (+) Fibromyalgia -  Abdominal Normal abdominal exam  (+)   Peds  Hematology negative hematology ROS (+)   Anesthesia Other Findings   Reproductive/Obstetrics                            Anesthesia Physical Anesthesia Plan  ASA: II  Anesthesia Plan: General   Post-op Pain Management: GA combined w/ Regional for post-op pain   Induction: Intravenous  PONV Risk Score and Plan: 4 or greater and Ondansetron, Dexamethasone, Midazolam and Scopolamine patch - Pre-op  Airway Management Planned: Oral ETT  Additional Equipment: None  Intra-op Plan:   Post-operative Plan: Extubation in OR  Informed Consent: I have reviewed the patients History and Physical, chart, labs and discussed the procedure including the risks, benefits and alternatives for the proposed anesthesia with the patient or authorized representative who has indicated his/her understanding and acceptance.   Dental advisory given  Plan Discussed with: CRNA  Anesthesia Plan Comments:        Anesthesia Quick Evaluation

## 2017-04-26 ENCOUNTER — Ambulatory Visit (HOSPITAL_COMMUNITY)
Admission: RE | Admit: 2017-04-26 | Discharge: 2017-04-26 | Disposition: A | Discharge status: Home / Self Care | Payer: Medicaid Other | Source: Ambulatory Visit | Attending: Orthopedic Surgery | Admitting: Orthopedic Surgery

## 2017-04-26 ENCOUNTER — Encounter (INDEPENDENT_AMBULATORY_CARE_PROVIDER_SITE_OTHER): Payer: Self-pay | Admitting: Orthopedic Surgery

## 2017-04-26 ENCOUNTER — Encounter (HOSPITAL_COMMUNITY)
Admission: RE | Disposition: A | Discharge status: Home / Self Care | Payer: Self-pay | Source: Ambulatory Visit | Attending: Orthopedic Surgery

## 2017-04-26 ENCOUNTER — Ambulatory Visit (HOSPITAL_COMMUNITY): Payer: Medicaid Other | Admitting: Anesthesiology

## 2017-04-26 ENCOUNTER — Encounter (HOSPITAL_COMMUNITY): Payer: Self-pay | Admitting: *Deleted

## 2017-04-26 DIAGNOSIS — S43431A Superior glenoid labrum lesion of right shoulder, initial encounter: Secondary | ICD-10-CM | POA: Diagnosis not present

## 2017-04-26 DIAGNOSIS — S43432A Superior glenoid labrum lesion of left shoulder, initial encounter: Secondary | ICD-10-CM

## 2017-04-26 DIAGNOSIS — G4730 Sleep apnea, unspecified: Secondary | ICD-10-CM | POA: Insufficient documentation

## 2017-04-26 DIAGNOSIS — K760 Fatty (change of) liver, not elsewhere classified: Secondary | ICD-10-CM | POA: Insufficient documentation

## 2017-04-26 DIAGNOSIS — G43909 Migraine, unspecified, not intractable, without status migrainosus: Secondary | ICD-10-CM | POA: Diagnosis not present

## 2017-04-26 DIAGNOSIS — Z79899 Other long term (current) drug therapy: Secondary | ICD-10-CM | POA: Diagnosis not present

## 2017-04-26 DIAGNOSIS — N3010 Interstitial cystitis (chronic) without hematuria: Secondary | ICD-10-CM | POA: Insufficient documentation

## 2017-04-26 DIAGNOSIS — F329 Major depressive disorder, single episode, unspecified: Secondary | ICD-10-CM | POA: Diagnosis not present

## 2017-04-26 DIAGNOSIS — Z880 Allergy status to penicillin: Secondary | ICD-10-CM | POA: Diagnosis not present

## 2017-04-26 DIAGNOSIS — M7552 Bursitis of left shoulder: Secondary | ICD-10-CM | POA: Diagnosis not present

## 2017-04-26 DIAGNOSIS — Z87442 Personal history of urinary calculi: Secondary | ICD-10-CM | POA: Insufficient documentation

## 2017-04-26 DIAGNOSIS — M797 Fibromyalgia: Secondary | ICD-10-CM | POA: Insufficient documentation

## 2017-04-26 DIAGNOSIS — W19XXXA Unspecified fall, initial encounter: Secondary | ICD-10-CM | POA: Insufficient documentation

## 2017-04-26 DIAGNOSIS — Z90710 Acquired absence of both cervix and uterus: Secondary | ICD-10-CM | POA: Diagnosis not present

## 2017-04-26 DIAGNOSIS — F419 Anxiety disorder, unspecified: Secondary | ICD-10-CM | POA: Insufficient documentation

## 2017-04-26 HISTORY — DX: Anxiety disorder, unspecified: F41.9

## 2017-04-26 HISTORY — DX: Depression, unspecified: F32.A

## 2017-04-26 HISTORY — DX: Other specified postprocedural states: Z98.890

## 2017-04-26 HISTORY — DX: Fatty (change of) liver, not elsewhere classified: K76.0

## 2017-04-26 HISTORY — DX: Fibromyalgia: M79.7

## 2017-04-26 HISTORY — DX: Major depressive disorder, single episode, unspecified: F32.9

## 2017-04-26 HISTORY — DX: Headache, unspecified: R51.9

## 2017-04-26 HISTORY — DX: Sleep apnea, unspecified: G47.30

## 2017-04-26 HISTORY — DX: Nausea with vomiting, unspecified: R11.2

## 2017-04-26 HISTORY — DX: Headache: R51

## 2017-04-26 HISTORY — DX: Personal history of urinary calculi: Z87.442

## 2017-04-26 HISTORY — PX: SHOULDER ARTHROSCOPY WITH SUBACROMIAL DECOMPRESSION, ROTATOR CUFF REPAIR AND BICEP TENDON REPAIR: SHX5687

## 2017-04-26 HISTORY — DX: Interstitial cystitis (chronic) without hematuria: N30.10

## 2017-04-26 LAB — COMPREHENSIVE METABOLIC PANEL
ALT: 15 U/L (ref 14–54)
AST: 23 U/L (ref 15–41)
Albumin: 3.5 g/dL (ref 3.5–5.0)
Alkaline Phosphatase: 73 U/L (ref 38–126)
Anion gap: 12 (ref 5–15)
BUN: 12 mg/dL (ref 6–20)
CHLORIDE: 112 mmol/L — AB (ref 101–111)
CO2: 17 mmol/L — ABNORMAL LOW (ref 22–32)
Calcium: 8.9 mg/dL (ref 8.9–10.3)
Creatinine, Ser: 0.79 mg/dL (ref 0.44–1.00)
GFR calc non Af Amer: >60 mL/min (ref >60)
Glucose, Bld: 95 mg/dL (ref 65–99)
POTASSIUM: 4 mmol/L (ref 3.5–5.1)
SODIUM: 141 mmol/L (ref 135–145)
Total Bilirubin: 0.3 mg/dL (ref 0.3–1.2)
Total Protein: 5.8 g/dL — ABNORMAL LOW (ref 6.5–8.1)

## 2017-04-26 LAB — CBC
HEMATOCRIT: 38.3 % (ref 36.0–46.0)
Hemoglobin: 12.3 g/dL (ref 12.0–15.0)
MCH: 29.2 pg (ref 26.0–34.0)
MCHC: 32.1 g/dL (ref 30.0–36.0)
MCV: 91 fL (ref 78.0–100.0)
Platelets: 251 10*3/uL (ref 150–400)
RBC: 4.21 MIL/uL (ref 3.87–5.11)
RDW: 14.5 % (ref 11.5–15.5)
WBC: 4.8 10*3/uL (ref 4.0–10.5)

## 2017-04-26 SURGERY — SHOULDER ARTHROSCOPY WITH SUBACROMIAL DECOMPRESSION, ROTATOR CUFF REPAIR AND BICEP TENDON REPAIR
Anesthesia: General | Laterality: Left

## 2017-04-26 MED ORDER — EPINEPHRINE PF 1 MG/ML IJ SOLN
INTRAMUSCULAR | Status: AC
  Filled 2017-04-26: qty 5

## 2017-04-26 MED ORDER — SCOPOLAMINE 1 MG/3DAYS TD PT72
MEDICATED_PATCH | TRANSDERMAL | Status: DC | PRN
  Administered 2017-04-26: 1 via TRANSDERMAL

## 2017-04-26 MED ORDER — ONDANSETRON HCL 4 MG/2ML IJ SOLN
INTRAMUSCULAR | Status: AC
  Filled 2017-04-26: qty 2

## 2017-04-26 MED ORDER — CHLORHEXIDINE GLUCONATE 4 % EX LIQD: 60.0000 mL | Freq: Once | CUTANEOUS | Status: DC

## 2017-04-26 MED ORDER — CLINDAMYCIN PHOSPHATE 900 MG/50ML IV SOLN
900.0000 mg | INTRAVENOUS | Status: AC
  Administered 2017-04-26: 900 mg via INTRAVENOUS
  Filled 2017-04-26: qty 50

## 2017-04-26 MED ORDER — SUGAMMADEX SODIUM 200 MG/2ML IV SOLN
INTRAVENOUS | Status: AC
  Filled 2017-04-26: qty 2

## 2017-04-26 MED ORDER — SUGAMMADEX SODIUM 200 MG/2ML IV SOLN
INTRAVENOUS | Status: DC | PRN
  Administered 2017-04-26: 200 mg via INTRAVENOUS
  Administered 2017-04-26: 100 mg via INTRAVENOUS

## 2017-04-26 MED ORDER — LIDOCAINE 2% (20 MG/ML) 5 ML SYRINGE
INTRAMUSCULAR | Status: AC
  Filled 2017-04-26: qty 5

## 2017-04-26 MED ORDER — FENTANYL CITRATE (PF) 100 MCG/2ML IJ SOLN
INTRAMUSCULAR | Status: DC | PRN
  Administered 2017-04-26 (×3): 50 ug via INTRAVENOUS

## 2017-04-26 MED ORDER — ROCURONIUM BROMIDE 100 MG/10ML IV SOLN
INTRAVENOUS | Status: DC | PRN
  Administered 2017-04-26: 70 mg via INTRAVENOUS

## 2017-04-26 MED ORDER — PROPOFOL 10 MG/ML IV BOLUS
INTRAVENOUS | Status: AC
  Filled 2017-04-26: qty 20

## 2017-04-26 MED ORDER — BUPIVACAINE-EPINEPHRINE (PF) 0.5% -1:200000 IJ SOLN
INTRAMUSCULAR | Status: DC | PRN
  Administered 2017-04-26: 20 mL via PERINEURAL

## 2017-04-26 MED ORDER — PROPOFOL 10 MG/ML IV BOLUS
INTRAVENOUS | Status: DC | PRN
  Administered 2017-04-26: 150 mg via INTRAVENOUS

## 2017-04-26 MED ORDER — MIDAZOLAM HCL 5 MG/5ML IJ SOLN
INTRAMUSCULAR | Status: DC | PRN
  Administered 2017-04-26: 2 mg via INTRAVENOUS

## 2017-04-26 MED ORDER — SODIUM CHLORIDE 0.9 % IR SOLN
Status: DC | PRN
  Administered 2017-04-26: 3000 mL

## 2017-04-26 MED ORDER — HYDROMORPHONE HCL 1 MG/ML IJ SOLN: 0.2500 mg | INTRAMUSCULAR | Status: DC | PRN

## 2017-04-26 MED ORDER — ONDANSETRON HCL 4 MG/2ML IJ SOLN
INTRAMUSCULAR | Status: DC | PRN
  Administered 2017-04-26: 4 mg via INTRAVENOUS

## 2017-04-26 MED ORDER — EPINEPHRINE PF 1 MG/ML IJ SOLN
INTRAMUSCULAR | Status: DC | PRN
  Administered 2017-04-26: 1 mg

## 2017-04-26 MED ORDER — MEPERIDINE HCL 50 MG/ML IJ SOLN: 6.2500 mg | INTRAMUSCULAR | Status: DC | PRN

## 2017-04-26 MED ORDER — PROMETHAZINE HCL 25 MG/ML IJ SOLN: 6.2500 mg | INTRAMUSCULAR | Status: DC | PRN

## 2017-04-26 MED ORDER — SODIUM CHLORIDE 0.9 % IJ SOLN
INTRAMUSCULAR | Status: DC | PRN
  Administered 2017-04-26: 50 mL

## 2017-04-26 MED ORDER — FENTANYL CITRATE (PF) 250 MCG/5ML IJ SOLN
INTRAMUSCULAR | Status: AC
  Filled 2017-04-26: qty 5

## 2017-04-26 MED ORDER — PHENYLEPHRINE HCL 10 MG/ML IJ SOLN
INTRAVENOUS | Status: DC | PRN
  Administered 2017-04-26: 25 ug/min via INTRAVENOUS

## 2017-04-26 MED ORDER — MIDAZOLAM HCL 2 MG/2ML IJ SOLN
INTRAMUSCULAR | Status: AC
  Filled 2017-04-26: qty 2

## 2017-04-26 MED ORDER — DEXAMETHASONE SODIUM PHOSPHATE 10 MG/ML IJ SOLN
INTRAMUSCULAR | Status: DC | PRN
  Administered 2017-04-26: 10 mg via INTRAVENOUS

## 2017-04-26 MED ORDER — LACTATED RINGERS IV SOLN: INTRAVENOUS | Status: DC

## 2017-04-26 MED ORDER — LACTATED RINGERS IV SOLN
INTRAVENOUS | Status: DC | PRN
  Administered 2017-04-26 (×2): via INTRAVENOUS

## 2017-04-26 SURGICAL SUPPLY — 63 items
ANCHOR SUT SWIVELLOK BIO (Anchor) ×2 IMPLANT
BLADE CUTTER GATOR 3.5 (BLADE) ×2 IMPLANT
BLADE GREAT WHITE 4.2 (BLADE) IMPLANT
BLADE SURG 11 STRL SS (BLADE) IMPLANT
BUR OVAL 6.0 (BURR) ×2 IMPLANT
COVER SURGICAL LIGHT HANDLE (MISCELLANEOUS) ×2 IMPLANT
DRAPE INCISE IOBAN 66X45 STRL (DRAPES) ×4 IMPLANT
DRAPE STERI 35X30 U-POUCH (DRAPES) ×2 IMPLANT
DRAPE U-SHAPE 47X51 STRL (DRAPES) ×4 IMPLANT
DRSG TEGADERM 4X4.75 (GAUZE/BANDAGES/DRESSINGS) ×2 IMPLANT
DURAPREP 26ML APPLICATOR (WOUND CARE) ×2 IMPLANT
ELECT REM PT RETURN 9FT ADLT (ELECTROSURGICAL) ×2
ELECTRODE REM PT RTRN 9FT ADLT (ELECTROSURGICAL) ×1 IMPLANT
FILTER STRAW FLUID ASPIR (MISCELLANEOUS) ×2 IMPLANT
GAUZE SPONGE 4X4 12PLY STRL (GAUZE/BANDAGES/DRESSINGS) ×2 IMPLANT
GAUZE SPONGE 4X4 12PLY STRL LF (GAUZE/BANDAGES/DRESSINGS) ×2 IMPLANT
GAUZE XEROFORM 1X8 LF (GAUZE/BANDAGES/DRESSINGS) ×2 IMPLANT
GLOVE BIOGEL PI IND STRL 7.5 (GLOVE) ×1 IMPLANT
GLOVE BIOGEL PI IND STRL 8 (GLOVE) ×1 IMPLANT
GLOVE BIOGEL PI INDICATOR 7.5 (GLOVE) ×1
GLOVE BIOGEL PI INDICATOR 8 (GLOVE) ×1
GLOVE ECLIPSE 7.0 STRL STRAW (GLOVE) ×2 IMPLANT
GLOVE SURG ORTHO 8.0 STRL STRW (GLOVE) ×2 IMPLANT
GOWN STRL REUS W/ TWL LRG LVL3 (GOWN DISPOSABLE) ×3 IMPLANT
GOWN STRL REUS W/TWL LRG LVL3 (GOWN DISPOSABLE) ×3
KIT BASIN OR (CUSTOM PROCEDURE TRAY) ×2 IMPLANT
KIT ROOM TURNOVER OR (KITS) ×2 IMPLANT
MANIFOLD NEPTUNE II (INSTRUMENTS) ×2 IMPLANT
NDL SUT 6 .5 CRC .975X.05 MAYO (NEEDLE) IMPLANT
NEEDLE HYPO 25X1 1.5 SAFETY (NEEDLE) IMPLANT
NEEDLE MAYO TAPER (NEEDLE)
NEEDLE SCORPION MULTI FIRE (NEEDLE) IMPLANT
NEEDLE SPNL 18GX3.5 QUINCKE PK (NEEDLE) ×2 IMPLANT
NS IRRIG 1000ML POUR BTL (IV SOLUTION) ×2 IMPLANT
PACK SHOULDER (CUSTOM PROCEDURE TRAY) ×2 IMPLANT
PAD ARMBOARD 7.5X6 YLW CONV (MISCELLANEOUS) ×4 IMPLANT
RESTRAINT HEAD UNIVERSAL NS (MISCELLANEOUS) ×2 IMPLANT
SET ARTHROSCOPY TUBING (MISCELLANEOUS) ×1
SET ARTHROSCOPY TUBING LN (MISCELLANEOUS) ×1 IMPLANT
SLING ARM IMMOBILIZER LRG (SOFTGOODS) ×2 IMPLANT
SPONGE LAP 4X18 X RAY DECT (DISPOSABLE) ×4 IMPLANT
STRIP CLOSURE SKIN 1/2X4 (GAUZE/BANDAGES/DRESSINGS) ×2 IMPLANT
SUCTION FRAZIER HANDLE 10FR (MISCELLANEOUS) ×1
SUCTION TUBE FRAZIER 10FR DISP (MISCELLANEOUS) ×1 IMPLANT
SUT ETHILON 3 0 PS 1 (SUTURE) ×4 IMPLANT
SUT FIBERWIRE #2 38 T-5 BLUE (SUTURE)
SUT MNCRL AB 3-0 PS2 18 (SUTURE) ×2 IMPLANT
SUT VIC AB 0 CT1 27 (SUTURE) ×1
SUT VIC AB 0 CT1 27XBRD ANBCTR (SUTURE) ×1 IMPLANT
SUT VIC AB 1 CT1 27 (SUTURE)
SUT VIC AB 1 CT1 27XBRD ANBCTR (SUTURE) IMPLANT
SUT VIC AB 2-0 CT1 27 (SUTURE) ×1
SUT VIC AB 2-0 CT1 TAPERPNT 27 (SUTURE) ×1 IMPLANT
SUT VICRYL 0 UR6 27IN ABS (SUTURE) ×2 IMPLANT
SUTURE FIBERWR #2 38 T-5 BLUE (SUTURE) IMPLANT
SYR 20CC LL (SYRINGE) ×4 IMPLANT
SYR 3ML LL SCALE MARK (SYRINGE) ×2 IMPLANT
SYR TB 1ML LUER SLIP (SYRINGE) ×2 IMPLANT
TOWEL OR 17X24 6PK STRL BLUE (TOWEL DISPOSABLE) ×2 IMPLANT
TOWEL OR 17X26 10 PK STRL BLUE (TOWEL DISPOSABLE) ×2 IMPLANT
WAND HAND CNTRL MULTIVAC 90 (MISCELLANEOUS) IMPLANT
WAND STAR VAC 90 (SURGICAL WAND) ×2 IMPLANT
WATER STERILE IRR 1000ML POUR (IV SOLUTION) ×2 IMPLANT

## 2017-04-26 NOTE — Anesthesia Postprocedure Evaluation (Signed)
Anesthesia Post Note  Patient: Katrina MurrainSandra Clark  Procedure(s) Performed: LEFT SHOULDER ARTHROSCOPY, DEBRIDEMENT, BICEPS TENODESIS, AND SUBACROMIAL DECOMPRESSION (Left )     Patient location during evaluation: PACU Anesthesia Type: General and Regional Level of consciousness: awake and alert Pain management: pain level controlled Vital Signs Assessment: post-procedure vital signs reviewed and stable Respiratory status: spontaneous breathing, nonlabored ventilation, respiratory function stable and patient connected to nasal cannula oxygen Cardiovascular status: blood pressure returned to baseline and stable Postop Assessment: no apparent nausea or vomiting Anesthetic complications: no    Last Vitals:  Vitals:   04/26/17 0958 04/26/17 1005  BP: 122/80   Pulse:  90  Resp:  18  Temp: (!) 36.1 C   SpO2: 96% 96%    Last Pain:  Vitals:   04/26/17 1005  TempSrc:   PainSc: 0-No pain                 Shelton SilvasKevin D Hollis

## 2017-04-26 NOTE — Anesthesia Procedure Notes (Signed)
Procedure Name: Intubation Date/Time: 04/26/2017 7:39 AM Performed by: Lovie Cholock, Esdras Delair K, CRNA Pre-anesthesia Checklist: Patient identified, Emergency Drugs available, Suction available and Patient being monitored Patient Re-evaluated:Patient Re-evaluated prior to induction Oxygen Delivery Method: Circle System Utilized Preoxygenation: Pre-oxygenation with 100% oxygen Induction Type: IV induction Ventilation: Mask ventilation without difficulty Laryngoscope Size: Miller and 2 Grade View: Grade I Tube type: Oral Tube size: 7.5 mm Number of attempts: 1 Airway Equipment and Method: Stylet and Oral airway Placement Confirmation: ETT inserted through vocal cords under direct vision,  positive ETCO2 and breath sounds checked- equal and bilateral Secured at: 21 cm Tube secured with: Tape Dental Injury: Teeth and Oropharynx as per pre-operative assessment

## 2017-04-26 NOTE — Op Note (Signed)
NAMAshley Murrain:  Clark, Katrina              ACCOUNT NO.:  [id number]  MEDICAL RECORD NO.:  [id number]  LOCATION:  [facility]                         FACILITY:  [facility]  PHYSICIAN:  Burnard BuntingG. Scott Dean, M.D.    DATE OF BIRTH:  1965/08/08  DATE OF PROCEDURE:  04/26/2017 DATE OF DISCHARGE:                              OPERATIVE REPORT   PREOPERATIVE DIAGNOSIS:  Left shoulder SLAP tear and bursitis.  POSTOPERATIVE DIAGNOSIS:  Left shoulder SLAP tear and bursitis.  PROCEDURE:  Left shoulder arthroscopy with biceps tendon release, superior labral debridement, subacromial decompression with small acromioplasty, and open biceps tenodesis.  SURGEON:  Burnard BuntingG. Scott Dean, M.D.  ASSISTANT:  Patrick Jupiterarla Bethune, RNFA.  INDICATIONS:  Katrina Clark is a patient with a significant left shoulder pain. She has SLAP tear by MRI scan.  She has had a subacromial injection, which gave her marginal and short-term relief.  Reports continued mechanical symptoms in the shoulder.  She did sustain a fall about 6 months ago.  She presents now for operative management after explanation of risks and benefits.  PROCEDURE IN DETAIL:  The patient was brought to the operating room, where general endotracheal anesthesia was induced.  Preoperative antibiotics were administered.  Time-out was called.  Left shoulder was examined under anesthesia and found to have good stability anterior and posterior along with full passive range of motion with forward flexion, symmetric external rotation to about 65 degrees bilaterally as well as isolated glenohumeral abduction about 90.  Following examination under anesthesia, the patient was placed in the beach chair position with the head in neutral position.  Left shoulder, arm and hand prescrubbed with alcohol and Betadine, allowed to air dry, prepped with DuraPrep solution and draped in sterile manner.  Time-out was called.  The posterior portal created 2 cm medial and inferior to the posterolateral margin of the  acromion, diagnostic arthroscopy was performed.  The patient did have type 2 SLAP tear, which was unstable and frayed.  This was debrided using ArthroCare wand after creation of an anterior portal.  Biceps tendon was released.  Rotator cuff was completely intact.  Anterior- inferior and posterior-inferior glenohumeral ligaments also intact. Following biceps tendon release, the scope was placed into the subacromial space.  Lateral portal was created.  Bursectomy was performed along with release of the CA ligament and a small acromioplasty.  The rotator cuff intact from the bursal side as well. At this time, instruments were removed from the portals, which were closed using 3-0 nylon.  Incision was then made off the anterolateral margin of the acromion.  This was about a 2.5 cm incision.  Skin and subcutaneous tissue were divided.  Deltoid was split about 3 cm off the anterolateral acromial edge.  Transverse humeral ligament was then released on the medial aspect and the biceps tendon was tenodesed into the bicipital groove under appropriate tension using FiberLoop tape as well as 6.5 SwiveLock.  Deltoid split was then repaired using #1 Vicryl suture, followed by interrupted inverted 0 Vicryl suture, 2-0 Vicryl suture, and a 3-0 Monocryl.  Impervious dressings and a shoulder immobilizer placed.  The patient tolerated the procedure well without immediate complications.  Transferred to the recovery room in stable  condition.     Burnard Bunting, M.D.     GSD/MEDQ  D:  04/26/2017  T:  04/26/2017  Job:  [id number]

## 2017-04-26 NOTE — Anesthesia Procedure Notes (Signed)
Anesthesia Regional Block: Interscalene brachial plexus block   Pre-Anesthetic Checklist: ,, timeout performed, Correct Patient, Correct Site, Correct Laterality, Correct Procedure, Correct Position, site marked, Risks and benefits discussed,  Surgical consent,  Pre-op evaluation,  At surgeon's request and post-op pain management  Laterality: Left  Prep: chloraprep       Needles:  Injection technique: Single-shot  Needle Type: Echogenic Needle     Needle Length: 9cm  Needle Gauge: 21     Additional Needles:   Procedures:,,,, ultrasound used (permanent image in chart),,,,  Narrative:  Start time: 04/26/2017 7:10 AM End time: 04/26/2017 7:20 AM Injection made incrementally with aspirations every 5 mL.  Performed by: Personally  Anesthesiologist: Shelton SilvasHollis, Kevin D, MD  Additional Notes: Patient tolerated the procedure well. Local anesthetic introduced in an incremental fashion under minimal resistance after negative aspirations. No paresthesias were elicited. After completion of the procedure, no acute issues were identified and patient continued to be monitored by RN.

## 2017-04-26 NOTE — Brief Op Note (Signed)
04/26/2017  9:32 AM  PATIENT:  Katrina MurrainSandra Vieyra  52 y.o. female  PRE-OPERATIVE DIAGNOSIS:  Left Shoulder SLAP superior labrum from anterior to posterior Tear  POST-OPERATIVE DIAGNOSIS:  Left Shoulder SLAP superior labrum from anterior to posterior   PROCEDURE:  Procedure(s): LEFT SHOULDER ARTHROSCOPY, DEBRIDEMENT, BICEPS TENODESIS, AND SUBACROMIAL DECOMPRESSION  SURGEON:  Surgeon(s): August Saucerean, Corrie MckusickGregory Scott, MD  ASSISTANT: Patrick Jupiterarla Bethune rnfa  ANESTHESIA:   general  EBL: 35 ml    Total I/O In: 1250 [I.V.:1250] Out: 100 [Blood:100]  BLOOD ADMINISTERED: none  DRAINS: none   LOCAL MEDICATIONS USED:  none  SPECIMEN:  No Specimen  COUNTS:  YES  TOURNIQUET:  * No tourniquets in log *  DICTATION: .Other Dictation: Dictation Number [id number]  PLAN OF CARE: Discharge to home after PACU  PATIENT DISPOSITION:  PACU - hemodynamically stable

## 2017-04-26 NOTE — Transfer of Care (Signed)
Immediate Anesthesia Transfer of Care Note  Patient: Katrina MurrainSandra Clark  Procedure(s) Performed: LEFT SHOULDER ARTHROSCOPY, DEBRIDEMENT, BICEPS TENODESIS, AND SUBACROMIAL DECOMPRESSION (Left )  Patient Location: PACU  Anesthesia Type:General  Level of Consciousness: oriented, drowsy and patient cooperative  Airway & Oxygen Therapy: Patient Spontanous Breathing and Patient connected to nasal cannula oxygen  Post-op Assessment: Report given to RN and Post -op Vital signs reviewed and stable  Post vital signs: Reviewed  Last Vitals:  Vitals:   04/26/17 0633 04/26/17 0942  BP: 114/80 125/76  Pulse: 64 94  Resp: 18 (!) 8  Temp: 36.6 C (!) 36.3 C  SpO2: 96% 95%    Last Pain:  Vitals:   04/26/17 0633  TempSrc: Oral  PainSc:       Patients Stated Pain Goal: 4 (04/26/17 16100609)  Complications: No apparent anesthesia complications

## 2017-04-26 NOTE — H&P (Signed)
Katrina MurrainSandra Clark is an 52 y.o. female.   Chief Complaint: Left shoulder pain HPI: [name] is a 52 year old patient with left shoulder pain.  She has a long history of mechanical symptoms and pain in that left shoulder.  MRI scan shows type II SLAP tear.  She presents now for operative management after failure of conservative management.  She reports generally daily symptoms are typically worse with overhead motion  Past Medical History:  Diagnosis Date  . Anxiety   . Depression   . Fatty liver    "told a long time ago I had a fatty liver"  . Fibromyalgia   . Headache    migraines, cluster headaches takes Vapramil for headaches  . History of kidney stones   . Interstitial cystitis   . PONV (postoperative nausea and vomiting)   . Sleep apnea    no CPAP, has lost weight since Sleep test    Past Surgical History:  Procedure Laterality Date  . ABDOMINAL HYSTERECTOMY    . CYSTOSCOPY WITH HYDRODISTENSION AND BIOPSY     [facility]  . FOOT SURGERY    . screws removed     and plate removed from left foot  . thumb surgery     [facility], [facility]    History reviewed. No pertinent family history. Social History:  reports that  has never smoked. she has never used smokeless tobacco. She reports that she does not drink alcohol or use drugs.  Allergies:  Allergies  Allergen Reactions  . Penicillins Rash and Other (See Comments)    Has patient had a PCN reaction causing immediate rash, facial/tongue/throat swelling, SOB or lightheadedness with hypotension: Unknown Has patient had a PCN reaction causing severe rash involving mucus membranes or skin necrosis: Unknown Has patient had a PCN reaction that required hospitalization: Unknown Has patient had a PCN reaction occurring within the last 10 years: No If all of the above answers are "NO", then may proceed with Cephalosporin use.   .  Sulfamethoxazole-Trimethoprim Rash    Medications Prior to Admission  Medication Sig Dispense Refill  . baclofen (LIORESAL) 20 MG tablet Take 20 mg by mouth 3 (three) times daily.    . DULoxetine (CYMBALTA) 30 MG capsule Take 90 mg by mouth once daily at bedtime  1  . estradiol (ESTRACE) 1 MG tablet Take 1 mg by mouth once daily    . HYDROcodone-acetaminophen (NORCO/VICODIN) 5-325 MG tablet Take 1 tablet by mouth every 6 (six) hours as needed for moderate pain. 40 tablet 0  . Methen-Hyosc-Meth Blue-Na Phos (ME/NAPHOS/MB/HYO1) 81.6 MG TABS Take 1 tablet by mouth every 6 hours as needed for burning    . pregabalin (LYRICA) 200 MG capsule Take 200 mg by mouth twice daily    . SUMAtriptan (IMITREX) 5 MG/ACT nasal spray Place 1 spray into the nose every 2 (two) hours as needed for migraine.     . tamsulosin (FLOMAX) 0.4 MG CAPS capsule Take 0.4 mg by mouth daily.    . tizanidine (ZANAFLEX) 6 MG capsule Take 6 mg by mouth 3 (three) times daily as needed for muscle spasms.    Marland Kitchen. topiramate (TOPAMAX) 25 MG tablet Take 50 mg by mouth 2 (two) times daily.    . traZODone (DESYREL) 150 MG tablet Take 150 mg by mouth at bedtime    . verapamil (CALAN-SR) 180 MG CR tablet Take 180 mg by mouth once daily    .  VITAMIN E PO Take 1 capsule by mouth daily.    Marland Kitchen zolpidem (AMBIEN) 10 MG tablet Take 5 mg by mouth at bedtime      Results for orders placed or performed during the hospital encounter of 04/26/17 (from the past 48 hour(s))  CBC     Status: None   Collection Time: 04/26/17  6:21 AM  Result Value Ref Range   WBC 4.8 4.0 - 10.5 K/uL   RBC 4.21 3.87 - 5.11 MIL/uL   Hemoglobin 12.3 12.0 - 15.0 g/dL   HCT 96.0 45.4 - 09.8 %   MCV 91.0 78.0 - 100.0 fL   MCH 29.2 26.0 - 34.0 pg   MCHC 32.1 30.0 - 36.0 g/dL   RDW 11.9 14.7 - 82.9 %   Platelets 251 150 - 400 K/uL    Comment: Performed at [facility], [address] [place], [place] [address]   No results found.  Review of Systems   Musculoskeletal: Positive for joint pain.  All other systems reviewed and are negative.   Blood pressure 114/80, pulse 64, temperature 97.8 F (36.6 C), temperature source Oral, resp. rate 18, height 5\' 5"  (1.651 m), weight 204 lb (92.5 kg), SpO2 96 %. Physical Exam  Constitutional: She appears well-developed.  HENT:  Head: Normocephalic.  Eyes: Pupils are equal, round, and reactive to light.  Neck: Normal range of motion.  Cardiovascular: Normal rate.  Respiratory: Effort normal.  Neurological: She is alert.  Skin: Skin is warm.  Psychiatric: She has a normal mood and affect.  Examination of the left shoulder demonstrates reasonable passive range of motion.  Rotator cuff strength is intact.  Negative apprehension relocation testing.  Radial pulses intact.  She does have positive O'Brien's testing and no AC joint tenderness on the left-hand side  Assessment/Plan Impression is type II SLAP tear with some component of bursitis.  Plan is arthroscopy with biceps tendon release subacromial decompression and biceps tenodesis.  Risk and benefits are discussed including but not limited to infection nerve vessel damage incomplete pain relief as well as potential need for more surgery.  All questions answered.  Burnard Bunting, MD 04/26/2017, 7:22 AM

## 2017-04-26 NOTE — Anesthesia Procedure Notes (Signed)
Procedures

## 2017-04-27 ENCOUNTER — Inpatient Hospital Stay (INDEPENDENT_AMBULATORY_CARE_PROVIDER_SITE_OTHER): Payer: Medicaid Other | Admitting: Orthopedic Surgery

## 2017-04-27 ENCOUNTER — Encounter (INDEPENDENT_AMBULATORY_CARE_PROVIDER_SITE_OTHER): Payer: Self-pay | Admitting: Orthopedic Surgery

## 2017-04-28 ENCOUNTER — Telehealth (INDEPENDENT_AMBULATORY_CARE_PROVIDER_SITE_OTHER): Payer: Self-pay | Admitting: Orthopedic Surgery

## 2017-04-28 ENCOUNTER — Encounter (HOSPITAL_COMMUNITY): Payer: Self-pay | Admitting: Orthopedic Surgery

## 2017-04-28 NOTE — Telephone Encounter (Signed)
IC s/w patient advised per Dr August Saucerean unable to prescribe anything stronger. Advised needed to use ice 20 min on and 20 min off and try dose of anti-inflammatory between doses of pain medication.

## 2017-04-28 NOTE — Telephone Encounter (Signed)
Pt called med is currently not working  oxycodone  Post op visit 05/04/17

## 2017-05-04 ENCOUNTER — Encounter (INDEPENDENT_AMBULATORY_CARE_PROVIDER_SITE_OTHER): Payer: Self-pay | Admitting: Orthopedic Surgery

## 2017-05-04 ENCOUNTER — Ambulatory Visit (INDEPENDENT_AMBULATORY_CARE_PROVIDER_SITE_OTHER): Payer: Medicaid Other | Admitting: Orthopedic Surgery

## 2017-05-04 DIAGNOSIS — M7542 Impingement syndrome of left shoulder: Secondary | ICD-10-CM

## 2017-05-04 MED ORDER — OXYCODONE HCL 5 MG PO TABS: ORAL_TABLET | ORAL | 0 refills | Status: AC

## 2017-05-04 MED ORDER — METHOCARBAMOL 500 MG PO TABS: ORAL_TABLET | ORAL | 0 refills | Status: AC

## 2017-05-08 NOTE — Progress Notes (Signed)
   Post-Op Visit Note   Patient: Katrina MurrainSandra Clark           Date of Birth: Jun 20, 1965           MRN: [id number] Visit Date: 05/04/2017 PCP: Jerry CarasPatterson, Jonathan, MD   Assessment & Plan:  Chief Complaint:  Chief Complaint  Patient presents with  . Left Shoulder - Follow-up, Routine Post Op   Visit Diagnoses:  1. Impingement syndrome of left shoulder     Plan: Katrina Clark is a patient with left shoulder arthroscopy and biceps tenodesis.  This was done 04/26/2017.  Taking oxycodone and Robaxin.  Examination shoulder moves well.  Biceps tendon is in good position and alignment.  She is doing her own physical therapy.  On examination she has improving passive range of motion.  I want her to discontinue use of the sling but avoid any direct biceps lifting.  Refill oxycodone and Robaxin.  Come back in 4 weeks.  Follow-Up Instructions: Return in about 4 weeks (around 06/01/2017).   Orders:  No orders of the defined types were placed in this encounter.  Meds ordered this encounter  Medications  . oxyCODONE (ROXICODONE) 5 MG immediate release tablet    Sig: 1 po q 8-12hrs prn pain    Dispense:  42 tablet    Refill:  0  . methocarbamol (ROBAXIN) 500 MG tablet    Sig: 1 po q 12 hrs prn    Dispense:  30 tablet    Refill:  0    Imaging: No results found.  PMFS History: There are no active problems to display for this patient.  Past Medical History:  Diagnosis Date  . Anxiety   . Depression   . Fatty liver    "told a long time ago I had a fatty liver"  . Fibromyalgia   . Headache    migraines, cluster headaches takes Vapramil for headaches  . History of kidney stones   . Interstitial cystitis   . PONV (postoperative nausea and vomiting)   . Sleep apnea    no CPAP, has lost weight since Sleep test    History reviewed. No pertinent family history.  Past Surgical History:  Procedure Laterality Date  . ABDOMINAL HYSTERECTOMY    . CYSTOSCOPY WITH HYDRODISTENSION AND BIOPSY     has  [facility]  . FOOT SURGERY    . screws removed     and plate removed from left foot  . SHOULDER ARTHROSCOPY WITH SUBACROMIAL DECOMPRESSION, ROTATOR CUFF REPAIR AND BICEP TENDON REPAIR Left 04/26/2017   Procedure: LEFT SHOULDER ARTHROSCOPY, DEBRIDEMENT, BICEPS TENODESIS, AND SUBACROMIAL DECOMPRESSION;  Surgeon: Cammy Copaean, Jaivyn Gulla Scott, MD;  Location: [facility] OR;  Service: Orthopedics;  Laterality: Left;  . thumb surgery     nail [facility], [facility]   Social History   Occupational History  . Not on file  Tobacco Use  . Smoking status: Never Smoker  . Smokeless tobacco: Never Used  Substance and Sexual Activity  . Alcohol use: No    Frequency: Never  . Drug use: No  . Sexual activity: Not on file

## 2017-06-06 ENCOUNTER — Ambulatory Visit (INDEPENDENT_AMBULATORY_CARE_PROVIDER_SITE_OTHER): Payer: Medicaid Other | Admitting: Orthopedic Surgery

## 2017-06-06 ENCOUNTER — Encounter (INDEPENDENT_AMBULATORY_CARE_PROVIDER_SITE_OTHER): Payer: Self-pay | Admitting: Orthopedic Surgery

## 2017-06-06 DIAGNOSIS — M7542 Impingement syndrome of left shoulder: Secondary | ICD-10-CM

## 2017-06-09 ENCOUNTER — Encounter (INDEPENDENT_AMBULATORY_CARE_PROVIDER_SITE_OTHER): Payer: Self-pay | Admitting: Orthopedic Surgery

## 2017-06-09 NOTE — Progress Notes (Signed)
   Post-Op Visit Note   Patient: Katrina MurrainSandra Clark           Date of Birth: 1965-10-24           MRN: [id number] Visit Date: 06/06/2017 PCP: Patient, No Pcp Per   Assessment & Plan:  Chief Complaint:  Chief Complaint  Patient presents with  . Left Shoulder - Routine Post Op   Visit Diagnoses:  1. Impingement syndrome of left shoulder     Plan: Katrina DavenportSandra is a patient who is now 6 weeks out left shoulder arthroscopy debridement biceps tenodesis and subacromial decompression.  Patient states she has good and bad days.  She is getting some better.  Does exercises at home.  Unable to go to physical therapy due to insurance.  She did plant flowers and did well with that.  Not taking any medication right now.  On examination she has improving shoulder range of motion and good biceps tension.  Rotator cuff strength is good.  Impingement signs negative.  Plan at this time is to continue home exercise program.  Follow-up with me as needed.  Follow-Up Instructions: Return if symptoms worsen or fail to improve.   Orders:  No orders of the defined types were placed in this encounter.  No orders of the defined types were placed in this encounter.   Imaging: No results found.  PMFS History: There are no active problems to display for this patient.  Past Medical History:  Diagnosis Date  . Anxiety   . Depression   . Fatty liver    "told a long time ago I had a fatty liver"  . Fibromyalgia   . Headache    migraines, cluster headaches takes Vapramil for headaches  . History of kidney stones   . Interstitial cystitis   . PONV (postoperative nausea and vomiting)   . Sleep apnea    no CPAP, has lost weight since Sleep test    History reviewed. No pertinent family history.  Past Surgical History:  Procedure Laterality Date  . ABDOMINAL HYSTERECTOMY    . CYSTOSCOPY WITH HYDRODISTENSION AND BIOPSY     has this 1-2 [facility] per [facility]  . FOOT SURGERY    . screws removed       and plate removed from left foot  . SHOULDER ARTHROSCOPY WITH SUBACROMIAL DECOMPRESSION, ROTATOR CUFF REPAIR AND BICEP TENDON REPAIR Left 04/26/2017   Procedure: LEFT SHOULDER ARTHROSCOPY, DEBRIDEMENT, BICEPS TENODESIS, AND SUBACROMIAL DECOMPRESSION;  Surgeon: Cammy Copaean, Gregory Scott, MD;  Location: [facility] OR;  Service: Orthopedics;  Laterality: Left;  . thumb surgery     nail removed, [facility], right hand   Social History   Occupational History  . Not on file  Tobacco Use  . Smoking status: Never Smoker  . Smokeless tobacco: Never Used  Substance and Sexual Activity  . Alcohol use: No    Frequency: Never  . Drug use: No  . Sexual activity: Not on file

## 2019-02-17 IMAGING — NM NM BONE 3 PHASE
2 series · 12 of 12 positions shown · non-contrast
Comparison: Lumbar spine radiographs 05/27/2016

CLINICAL DATA: Chronic by SI joint pain and coccydynia for 5 years.

EXAM:
NUCLEAR MEDICINE 3-PHASE BONE SCAN
TECHNIQUE: Radionuclide angiographic images, immediate static blood pool
images, and 3-hour delayed static images were obtained of the pelvis
and hips after intravenous injection of radiopharmaceutical.
RADIOPHARMACEUTICALS:  19.2 mCi Dc-55m MDP

[Series 1: flow · 4.14mm/px · 6 of 33 frames shown (1 of 2)]
[frame 3/33  full-range]
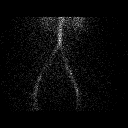
[frame 9/33  full-range]
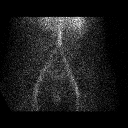
[frame 14/33  full-range]
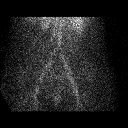
[frame 20/33  full-range]
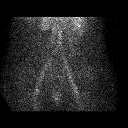
[frame 25/33  full-range]
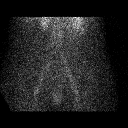
[frame 31/33  full-range]
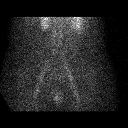

[Series 1: flow · 4.14mm/px · 6 of 40 frames shown (2 of 2)]
[frame 4/40  full-range]
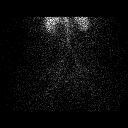
[frame 10/40  full-range]
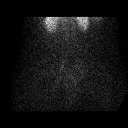
[frame 17/40  full-range]
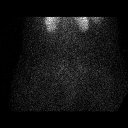
[frame 24/40  full-range]
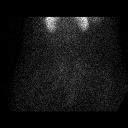
[frame 30/40  full-range]
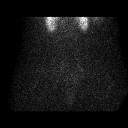
[frame 37/40  full-range]
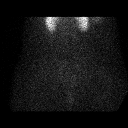

[12 of 12 positions shown; findings below may reference images not displayed]

FINDINGS: Vascular phase: Normal

Blood pool phase: Normal

Delayed phase: No significant findings.
IMPRESSION: Unremarkable bone scan. No areas of abnormal uptake in the lower
lumbar spine, pelvis or hips.

## 2019-11-11 IMAGING — XA DG FLUORO GUIDE NDL PLC/BX
1 series · 1 of 1 positions shown · non-contrast
Comparison: none

CLINICAL DATA: LEFT shoulder pain.

[Series 1: ortho standard · 1 of 1 slices shown]
[im 1/1]
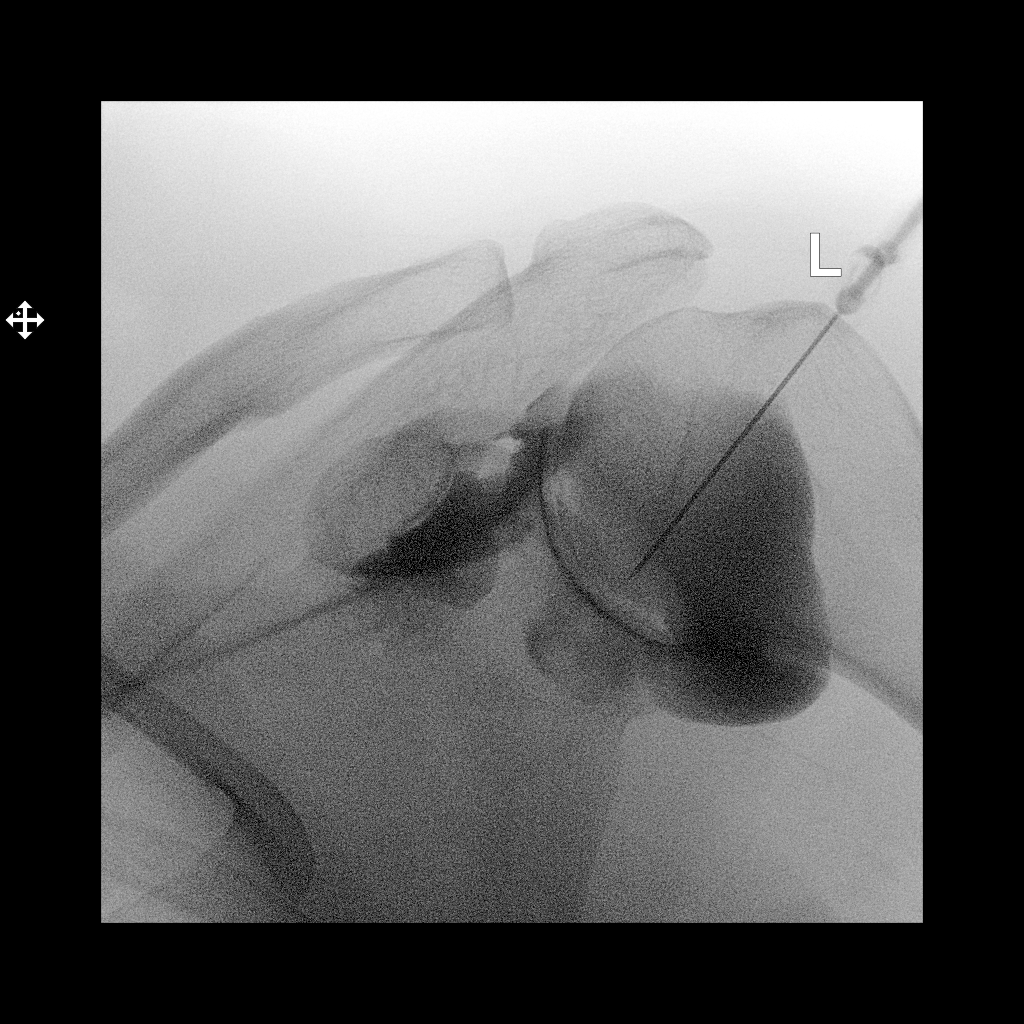

[1 of 1 positions shown; findings below may reference images not displayed]

FLUOROSCOPY TIME:  20 seconds corresponding to a Dose Area Product
of 12.83 ?Gy*m2

PROCEDURE:
LEFT SHOULDER INJECTION UNDER FLUOROSCOPY

Informed written consent was obtained. Time-out was performed. An
appropriate skin entrance site was determined. The site was marked,
prepped with Betadine, draped in the usual sterile fashion, and
infiltrated locally with 1% lidocaine. 22 gauge spinal needle was
advanced to the superomedial margin of the humeral head under
intermittent fluoroscopy. 1 ml of Lidocaine injected easily. A
mixture of 0.1 ml Multihance and 20 ml of dilute Isovue M 200 was
then used to opacify the LEFT shoulder capsule. No immediate
complication.
IMPRESSION: Technically successful LEFT shoulder injection for MRI.

## 2024-01-15 ENCOUNTER — Other Ambulatory Visit: Payer: Self-pay | Admitting: Medical Genetics

## 2024-02-09 ENCOUNTER — Other Ambulatory Visit

## 2024-03-30 ENCOUNTER — Other Ambulatory Visit: Payer: Self-pay | Admitting: Medical Genetics

## 2024-03-30 DIAGNOSIS — Z006 Encounter for examination for normal comparison and control in clinical research program: Secondary | ICD-10-CM

## 2024-04-11 ENCOUNTER — Other Ambulatory Visit (HOSPITAL_COMMUNITY)
